# Patient Record
Sex: Male | Born: 1990 | Race: Black or African American | Hispanic: No | Marital: Single | Smoking: Current every day smoker
Health system: Southern US, Community
[De-identification: ages and names within clinical notes are randomized; demographics above are authoritative.]

## PROBLEM LIST (undated history)

## (undated) HISTORY — PX: NECK SURGERY: SHX720

---
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
## 2016-10-16 ENCOUNTER — Encounter (HOSPITAL_BASED_OUTPATIENT_CLINIC_OR_DEPARTMENT_OTHER): Payer: Self-pay | Admitting: Emergency Medicine

## 2016-10-16 ENCOUNTER — Emergency Department (HOSPITAL_BASED_OUTPATIENT_CLINIC_OR_DEPARTMENT_OTHER)
Admission: EM | Admit: 2016-10-16 | Discharge: 2016-10-16 | Disposition: A | Discharge status: Home / Self Care | Payer: 59 | Attending: Emergency Medicine | Admitting: Emergency Medicine

## 2016-10-16 DIAGNOSIS — F17200 Nicotine dependence, unspecified, uncomplicated: Secondary | ICD-10-CM | POA: Diagnosis not present

## 2016-10-16 DIAGNOSIS — R109 Unspecified abdominal pain: Secondary | ICD-10-CM | POA: Insufficient documentation

## 2016-10-16 DIAGNOSIS — R197 Diarrhea, unspecified: Secondary | ICD-10-CM | POA: Diagnosis not present

## 2016-10-16 DIAGNOSIS — R112 Nausea with vomiting, unspecified: Secondary | ICD-10-CM

## 2016-10-16 LAB — CBC
HEMATOCRIT: 48.5 % (ref 39.0–52.0)
HEMOGLOBIN: 17 g/dL (ref 13.0–17.0)
MCH: 29.5 pg (ref 26.0–34.0)
MCHC: 35.1 g/dL (ref 30.0–36.0)
MCV: 84.1 fL (ref 78.0–100.0)
Platelets: 184 10*3/uL (ref 150–400)
RBC: 5.77 MIL/uL (ref 4.22–5.81)
RDW: 12.4 % (ref 11.5–15.5)
WBC: 6.6 10*3/uL (ref 4.0–10.5)

## 2016-10-16 LAB — URINALYSIS, ROUTINE W REFLEX MICROSCOPIC
Bilirubin Urine: NEGATIVE
Glucose, UA: NEGATIVE mg/dL
Hgb urine dipstick: NEGATIVE
KETONES UR: NEGATIVE mg/dL
LEUKOCYTES UA: NEGATIVE
NITRITE: NEGATIVE
PROTEIN: NEGATIVE mg/dL
Specific Gravity, Urine: 1.025 (ref 1.005–1.030)
pH: 6 (ref 5.0–8.0)

## 2016-10-16 LAB — LIPASE, BLOOD: LIPASE: 25 U/L (ref 11–51)

## 2016-10-16 LAB — COMPREHENSIVE METABOLIC PANEL
ALBUMIN: 4.7 g/dL (ref 3.5–5.0)
ALT: 17 U/L (ref 17–63)
ANION GAP: 5 (ref 5–15)
AST: 30 U/L (ref 15–41)
Alkaline Phosphatase: 80 U/L (ref 38–126)
BILIRUBIN TOTAL: 0.7 mg/dL (ref 0.3–1.2)
BUN: 12 mg/dL (ref 6–20)
CHLORIDE: 106 mmol/L (ref 101–111)
CO2: 28 mmol/L (ref 22–32)
Calcium: 9.7 mg/dL (ref 8.9–10.3)
Creatinine, Ser: 1.01 mg/dL (ref 0.61–1.24)
GFR calc Af Amer: >60 mL/min (ref >60)
GFR calc non Af Amer: >60 mL/min (ref >60)
GLUCOSE: 83 mg/dL (ref 65–99)
POTASSIUM: 4 mmol/L (ref 3.5–5.1)
SODIUM: 139 mmol/L (ref 135–145)
TOTAL PROTEIN: 8.2 g/dL — AB (ref 6.5–8.1)

## 2016-10-16 MED ORDER — ONDANSETRON HCL 4 MG PO TABS: 4.0000 mg | ORAL_TABLET | Freq: Three times a day (TID) | ORAL | 0 refills | Status: AC | PRN

## 2016-10-16 MED ORDER — HYDROCODONE-ACETAMINOPHEN 5-325 MG PO TABS
1.0000 | ORAL_TABLET | Freq: Once | ORAL | Status: AC
  Administered 2016-10-16: 1 via ORAL
  Filled 2016-10-16: qty 1

## 2016-10-16 MED ORDER — HYDROCODONE-ACETAMINOPHEN 5-325 MG PO TABS: 1.0000 | ORAL_TABLET | Freq: Four times a day (QID) | ORAL | 0 refills | Status: AC | PRN

## 2016-10-16 MED ORDER — ONDANSETRON HCL 4 MG/2ML IJ SOLN: 4.0000 mg | Freq: Once | INTRAMUSCULAR | Status: DC

## 2016-10-16 MED ORDER — SODIUM CHLORIDE 0.9 % IV BOLUS (SEPSIS): 1000.0000 mL | Freq: Once | INTRAVENOUS | Status: DC

## 2016-10-16 MED ORDER — ONDANSETRON 4 MG PO TBDP
4.0000 mg | ORAL_TABLET | Freq: Once | ORAL | Status: AC
  Administered 2016-10-16: 4 mg via ORAL
  Filled 2016-10-16: qty 1

## 2016-10-16 NOTE — ED Notes (Signed)
Pt comes into the ED with vomiting and diarrhea for two days nothing is relieving it, pt denies fever.

## 2016-10-16 NOTE — ED Provider Notes (Signed)
[facility]   CSN: [id number] Arrival date & time: 10/16/16  1130     History   Chief Complaint Chief Complaint  Patient presents with  . Emesis    HPI Karl Mitchell is a 25 y.o. male With no significant past medical history who presents with mild abdominal pain, nausea, vomiting, and diarrhea. Patient reports that for the last two days, he has had the nausea and vomiting. He said diarrhea developed today. He reports nonbloody nonbilious emesis. He reports nonbloody diarrhea. He is unsure of sick contacts but says he is exposed to lots of people with his work. He denies fevers, chills, chest pain, shortness of breath, constipation, or dysuria. He said he is having mild to moderate epigastric abdominal pain worsened with vomiting. He denies any radiation of pain, anything making a better, and denies any abdominal trauma. He denies any abdominal surgeries. He describes the pain as a cramping feeling.  The history is provided by the patient and a significant other. No language interpreter was used.  Emesis   This is a new problem. The current episode started 2 days ago. The problem occurs 2 to 4 times per day. The problem has been gradually improving. The emesis has an appearance of stomach contents. There has been no fever. Associated symptoms include abdominal pain and diarrhea. Pertinent negatives include no chills, no cough, no fever, no headaches and no URI. Risk factors include ill contacts.    History reviewed. No pertinent past medical history.  There are no active problems to display for this patient.   Past Surgical History:  Procedure Laterality Date  . NECK SURGERY         Home Medications    Prior to Admission medications   Not on File    Family History No family history on file.  Social History Social History  Substance Use Topics  . Smoking status: Current Every Day Smoker  . Smokeless tobacco: Never Used  . Alcohol use No      Allergies   Review of patient's allergies indicates no known allergies.   Review of Systems Review of Systems  Constitutional: Negative for activity change, chills, diaphoresis, fatigue and fever.  HENT: Negative for congestion and rhinorrhea.   Eyes: Negative for visual disturbance.  Respiratory: Negative for cough, chest tightness, shortness of breath, wheezing and stridor.   Cardiovascular: Negative for chest pain, palpitations and leg swelling.  Gastrointestinal: Positive for abdominal pain, diarrhea, nausea and vomiting. Negative for abdominal distention, blood in stool and constipation.  Genitourinary: Negative for difficulty urinating, dysuria, flank pain and testicular pain.  Musculoskeletal: Negative for back pain and gait problem.  Skin: Negative for rash and wound.  Neurological: Negative for dizziness, weakness, light-headedness, numbness and headaches.  Psychiatric/Behavioral: Negative for agitation.  All other systems reviewed and are negative.    Physical Exam Updated Vital Signs BP 149/75 (BP Location: Right Arm)   Pulse 63   Temp 98.2 F (36.8 C) (Oral)   Resp 18   Ht 6' (1.829 m)   Wt 195 lb (88.5 kg)   SpO2 100%   BMI 26.45 kg/m   Physical Exam  Constitutional: He is oriented to person, place, and time. He appears well-developed and well-nourished.  HENT:  Head: Normocephalic and atraumatic.  Mouth/Throat: Oropharynx is clear and moist. No oropharyngeal exudate.  Eyes: Conjunctivae are normal.  Neck: Neck supple.  Cardiovascular: Normal rate and regular rhythm.   No murmur heard. Pulmonary/Chest: Effort normal and breath  sounds normal. No respiratory distress. He exhibits no tenderness.  Abdominal: Soft. Bowel sounds are normal. He exhibits no distension. There is no tenderness.  Musculoskeletal: He exhibits no edema.  Neurological: He is alert and oriented to person, place, and time. He has normal reflexes. He displays normal reflexes. No  cranial nerve deficit. He exhibits normal muscle tone. Coordination normal.  Skin: Skin is warm and dry. Capillary refill takes less than 2 seconds. No erythema. No pallor.  Psychiatric: He has a normal mood and affect.  Nursing note and vitals reviewed.    ED Treatments / Results  Labs (all labs ordered are listed, but only abnormal results are displayed) Labs Reviewed  COMPREHENSIVE METABOLIC PANEL - Abnormal; Notable for the following:       Result Value   Total Protein 8.2 (*)    All other components within normal limits  URINALYSIS, ROUTINE W REFLEX MICROSCOPIC (NOT AT [facility])  LIPASE, BLOOD  CBC    EKG  EKG Interpretation None       Radiology No results found.  Procedures Procedures (including critical care time)  Medications Ordered in ED Medications  ondansetron (ZOFRAN-ODT) disintegrating tablet 4 mg (4 mg Oral Given 10/16/16 1521)  HYDROcodone-acetaminophen (NORCO/VICODIN) 5-325 MG per tablet 1 tablet (1 tablet Oral Given 10/16/16 1521)     Initial Impression / Assessment and Plan / ED Course  I have reviewed the triage vital signs and the nursing notes.  Pertinent labs & imaging results that were available during my care of the patient were reviewed by me and considered in my medical decision making (see chart for details).  Clinical Course    Karl Mitchell is a 25 y.o. male With no significant past medical history who presents with mild abdominal pain, nausea, vomiting, and diarrhea.  History and exam are seen above.  Patient did not have any tenderness on his abdomen. No CVA tenderness. Lungs clear, narrow exam intact. Normal capillary refill.  Based on symptoms and description, feel patient has a viral gastroenteritis causing his symptoms. Patient works at a [employer] and interacts with numerous people daily, likely being a source. Patient has been on exam.  Patient offered IV fluids, IV symptom medications however, patient is already  feeling better. Patient given PO pain medication, PO Zofran, and oral fluids. Patient able to tolerate fluids without difficulty.  Patient had unremarkable laboratory testing including no evidence of UTI, Pancreatitis, anemia, or significant electoral abnormality.  Patient given prescription for nausea medicine and pain medicine and instructed to follow up with a PCP. Patient given strict return precautions. Patient understood plan of care and was discharged in good condition with improvement in presenting symptoms.    Final Clinical Impressions(s) / ED Diagnoses   Final diagnoses:  Nausea vomiting and diarrhea    New Prescriptions Discharge Medication List as of 10/16/2016  3:36 PM    START taking these medications   Details  HYDROcodone-acetaminophen (NORCO/VICODIN) 5-325 MG tablet Take 1 tablet by mouth every 6 (six) hours as needed., Starting Sun 10/16/2016, Print    ondansetron (ZOFRAN) 4 MG tablet Take 1 tablet (4 mg total) by mouth every 8 (eight) hours as needed for nausea or vomiting., Starting Sun 10/16/2016, Print       Clinical Impression: 1. Nausea vomiting and diarrhea     Disposition: Discharge  Condition: Good  I have discussed the results, Dx and Tx plan with the pt(& family if present). He/she/they expressed understanding and agree(s) with the plan. Discharge  instructions discussed at great length. Strict return precautions discussed and pt &/or family have verbalized understanding of the instructions. No further questions at time of discharge.    Discharge Medication List as of 10/16/2016  3:36 PM    START taking these medications   Details  HYDROcodone-acetaminophen (NORCO/VICODIN) 5-325 MG tablet Take 1 tablet by mouth every 6 (six) hours as needed., Starting Sun 10/16/2016, Print    ondansetron (ZOFRAN) 4 MG tablet Take 1 tablet (4 mg total) by mouth every 8 (eight) hours as needed for nausea or vomiting., Starting Sun 10/16/2016, Print         [facility] Up: [facility] [address] [place] [phone] Schedule an appointment as soon as possible for a visit    [facility] [address] [id number] [address] [place] [phone]  If symptoms worsen     Heide Scaleshristopher J Tegeler, MD 10/17/16 [phone]

## 2016-10-16 NOTE — ED Triage Notes (Signed)
N/VD since yesterday with central abd pain, denies urinary symptoms.

## 2022-03-19 DEATH — deceased
